# Patient Record
Sex: Male | Born: 2000 | Race: Black or African American | Hispanic: No | Marital: Single | Smoking: Current every day smoker
Health system: Southern US, Community
[De-identification: ages and names within clinical notes are randomized; demographics above are authoritative.]

## PROBLEM LIST (undated history)

## (undated) DIAGNOSIS — F909 Attention-deficit hyperactivity disorder, unspecified type: Secondary | ICD-10-CM

---
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
## 2019-11-02 ENCOUNTER — Other Ambulatory Visit: Payer: Self-pay

## 2019-11-02 ENCOUNTER — Encounter (HOSPITAL_COMMUNITY): Payer: Self-pay

## 2019-11-02 ENCOUNTER — Ambulatory Visit (HOSPITAL_COMMUNITY)
Admission: EM | Admit: 2019-11-02 | Discharge: 2019-11-02 | Disposition: A | Discharge status: Home / Self Care | Payer: Medicaid Other | Attending: Family Medicine | Admitting: Family Medicine

## 2019-11-02 DIAGNOSIS — K644 Residual hemorrhoidal skin tags: Secondary | ICD-10-CM

## 2019-11-02 MED ORDER — NITROGLYCERIN 2 % TD OINT: 0.5000 [in_us] | TOPICAL_OINTMENT | Freq: Two times a day (BID) | TRANSDERMAL | 3 refills | Status: DC

## 2019-11-02 MED ORDER — HYDROCORTISONE (PERIANAL) 2.5 % EX CREA: 1.0000 "application " | TOPICAL_CREAM | Freq: Two times a day (BID) | CUTANEOUS | 0 refills | Status: DC

## 2019-11-02 NOTE — ED Provider Notes (Signed)
  Michael Leach    CSN: [id number] Arrival date & time: 11/02/19  1025   Chief Complaint  Patient presents with  . Hemorrhoids    Subjective: Patient is a 18 y.o. male here for possible hemorrhoids.  Pt was defecating this AM and his bottom started to hurt worse than usual. He has a personal and famhx of hemorrhoids. He tried OTC cream w/o relief. No bleeding. BM's are on harder side. He does not get much fiber or water in his diet.   ROS: GI: As noted in HPI  Past Medical History:  Diagnosis Date  . ADHD (attention deficit hyperactivity disorder)     Objective: BP 137/90 (BP Location: Left Arm)   Pulse 67   Temp 98 F (36.7 C) (Oral)   Resp 16   SpO2 99%  General: Awake, appears stated age Abd: S, NT, ND, BS+ Rectal: external hemorrhoid L lateral, no fissures or evidence of thrombosis Heart: RRR Lungs: CTAB, no rales, wheezes or rhonchi. No accessory muscle use Psych: Age appropriate judgment and insight, normal affect and mood   Final Clinical Impressions(s) / UC Diagnoses   Final diagnoses:  External hemorrhoid   Soaks. Normalized BM's. Creams as below.   Discharge Instructions   None    ED Prescriptions    Medication Sig Dispense Auth. Provider   hydrocortisone (ANUSOL-HC) 2.5 % rectal cream Place 1 application rectally 2 (two) times daily. 30 g Shelda Pal, DO   nitroGLYCERIN (NITROGLYN) 2 % ointment Apply 0.5 inches topically 2 (two) times daily for 28 days. 30 g Shelda Pal, DO        Riki Sheer [place], [place] 11/02/19 1135

## 2019-11-02 NOTE — ED Triage Notes (Signed)
Pt C/o hemorrhoids states after he used the bathroom this am he butt started hurting very bad.

## 2019-12-13 ENCOUNTER — Ambulatory Visit (HOSPITAL_COMMUNITY)
Admission: EM | Admit: 2019-12-13 | Discharge: 2019-12-13 | Disposition: A | Discharge status: Home / Self Care | Payer: Medicaid Other | Attending: Family Medicine | Admitting: Family Medicine

## 2019-12-13 ENCOUNTER — Encounter (HOSPITAL_COMMUNITY): Payer: Self-pay

## 2019-12-13 DIAGNOSIS — H66004 Acute suppurative otitis media without spontaneous rupture of ear drum, recurrent, right ear: Secondary | ICD-10-CM

## 2019-12-13 MED ORDER — AMOXICILLIN 875 MG PO TABS: 875.0000 mg | ORAL_TABLET | Freq: Two times a day (BID) | ORAL | 0 refills | Status: AC

## 2019-12-13 MED ORDER — NEOMYCIN-POLYMYXIN-HC 3.5-10000-1 OT SUSP: 4.0000 [drp] | OTIC | 0 refills | Status: AC

## 2019-12-13 MED ORDER — IBUPROFEN 800 MG PO TABS: 800.0000 mg | ORAL_TABLET | Freq: Three times a day (TID) | ORAL | 0 refills | Status: AC

## 2019-12-13 NOTE — Discharge Instructions (Signed)
Take the antibiotic 2 x a day Take two doses today Take the ibuprofen as needed pain Use the eardrops until the pain improves Return as needed

## 2019-12-13 NOTE — ED Provider Notes (Signed)
[place]    CSN: [id number] Arrival date & time: 12/13/19  5462      History   Chief Complaint Chief Complaint  Patient presents with  . Otalgia    HPI Michael Leach is a 19 y.o. male.   HPI Patient states that last night he started developing pain in his right ear.  He states his ear is very painful.  He is requesting eardrops.  He has had no cough cold runny nose or sore throat.  No fever.  No history of ear problems in the past. He has not taken any medication for the pain Healthy 19 year old on no medications. Past Medical History:  Diagnosis Date  . ADHD (attention deficit hyperactivity disorder)     There are no problems to display for this patient.   History reviewed. No pertinent surgical history.     Home Medications    Prior to Admission medications   Medication Sig Start Date End Date Taking? Authorizing Provider  amoxicillin (AMOXIL) 875 MG tablet Take 1 tablet (875 mg total) by mouth 2 (two) times daily for 10 days. 12/13/19 12/23/19  Raylene Everts, MD  ibuprofen (ADVIL) 800 MG tablet Take 1 tablet (800 mg total) by mouth 3 (three) times daily. 12/13/19   Raylene Everts, MD  neomycin-polymyxin-hydrocortisone (CORTISPORIN) 3.5-10000-1 OTIC suspension Place 4 drops into the right ear every 4 (four) hours. 12/13/19   Raylene Everts, MD    Family History History reviewed. No pertinent family history.  Social History Social History   Tobacco Use  . Smoking status: Light Tobacco Smoker  . Smokeless tobacco: Never Used  Substance Use Topics  . Alcohol use: Never  . Drug use: Not on file     Allergies   Patient has no known allergies.   Review of Systems Review of Systems  Constitutional: Negative for chills and fever.  HENT: Positive for ear pain. Negative for congestion and hearing loss.   Eyes: Negative for visual disturbance.  Respiratory: Negative for cough and shortness of breath.   Cardiovascular: Negative for chest  pain.  Gastrointestinal: Negative.   Genitourinary: Negative.   Musculoskeletal: Negative for myalgias.  Neurological: Negative.  Negative for headaches.  Psychiatric/Behavioral: Positive for sleep disturbance.     Physical Exam Triage Vital Signs ED Triage Vitals  Enc Vitals Group     BP 12/13/19 0840 121/73     Pulse Rate 12/13/19 0840 77     Resp 12/13/19 0840 16     Temp 12/13/19 0840 98.1 F (36.7 C)     Temp Source 12/13/19 0840 Oral     SpO2 12/13/19 0840 97 %     Weight --      Height --      Head Circumference --      Peak Flow --      Pain Score 12/13/19 0839 8     Pain Loc --      Pain Edu? --      Excl. in [place]? --    No data found.  Updated Vital Signs BP 121/73 (BP Location: Left Arm)   Pulse 77   Temp 98.1 F (36.7 C) (Oral)   Resp 16   SpO2 97%     Physical Exam Constitutional:      General: He is not in acute distress.    Appearance: He is well-developed and normal weight.     Comments: Appears uncomfortable.  Pleasant cooperative  HENT:     Head:  Normocephalic and atraumatic.     Right Ear: There is no impacted cerumen.     Left Ear: Tympanic membrane, ear canal and external ear normal.     Ears:     Comments: Right TM is red and bulging.  External auditory canal is erythematous.  No eardrum perforation    Mouth/Throat:     Mouth: Mucous membranes are moist.     Pharynx: No posterior oropharyngeal erythema.  Eyes:     Conjunctiva/sclera: Conjunctivae normal.     Pupils: Pupils are equal, round, and reactive to light.  Cardiovascular:     Rate and Rhythm: Regular rhythm.  Pulmonary:     Effort: Pulmonary effort is normal. No respiratory distress.  Abdominal:     General: There is no distension.  Musculoskeletal:        General: Normal range of motion.     Cervical back: Normal range of motion.  Skin:    General: Skin is warm and dry.  Neurological:     Mental Status: He is alert. Mental status is at baseline.     Gait: Gait normal.   Psychiatric:        Mood and Affect: Mood normal.        Behavior: Behavior normal.     Comments: Polite and cooperative      UC Treatments / Results  Labs (all labs ordered are listed, but only abnormal results are displayed) Labs Reviewed - No data to display  EKG   Radiology No results found.  Procedures Procedures (including critical care time)  Medications Ordered in UC Medications - No data to display  Initial Impression / Assessment and Plan / UC Course  I have reviewed the triage vital signs and the nursing notes.  Pertinent labs & imaging results that were available during my care of the patient were reviewed by me and considered in my medical decision making (see chart for details).     Final Clinical Impressions(s) / UC Diagnoses   Final diagnoses:  Non-recurrent acute suppurative otitis media of right ear without spontaneous rupture of tympanic membrane     Discharge Instructions     Take the antibiotic 2 x a day Take two doses today Take the ibuprofen as needed pain Use the eardrops until the pain improves Return as needed    ED Prescriptions    Medication Sig Dispense Auth. Provider   amoxicillin (AMOXIL) 875 MG tablet Take 1 tablet (875 mg total) by mouth 2 (two) times daily for 10 days. 20 tablet Eustace Moore, MD   ibuprofen (ADVIL) 800 MG tablet Take 1 tablet (800 mg total) by mouth 3 (three) times daily. 21 tablet Eustace Moore, MD   neomycin-polymyxin-hydrocortisone (CORTISPORIN) 3.5-10000-1 OTIC suspension Place 4 drops into the right ear every 4 (four) hours. 10 mL Eustace Moore, MD     PDMP not reviewed this encounter.   Eustace Moore, MD 12/13/19 1037

## 2019-12-13 NOTE — ED Triage Notes (Signed)
Pt presents to UC with right ear pain since last night. Pt states he feels a lot of pressure in his right ear when he hiccups.

## 2020-03-06 ENCOUNTER — Other Ambulatory Visit (HOSPITAL_COMMUNITY): Payer: Self-pay | Admitting: Radiology

## 2020-03-06 ENCOUNTER — Emergency Department (HOSPITAL_COMMUNITY): Payer: No Typology Code available for payment source

## 2020-03-06 ENCOUNTER — Emergency Department (HOSPITAL_COMMUNITY)
Admission: EM | Admit: 2020-03-06 | Discharge: 2020-03-06 | Disposition: A | Discharge status: Home / Self Care | Payer: No Typology Code available for payment source | Attending: Emergency Medicine | Admitting: Emergency Medicine

## 2020-03-06 ENCOUNTER — Emergency Department: Payer: Medicaid Other

## 2020-03-06 ENCOUNTER — Encounter (HOSPITAL_COMMUNITY): Payer: Self-pay

## 2020-03-06 ENCOUNTER — Other Ambulatory Visit: Payer: Self-pay

## 2020-03-06 DIAGNOSIS — Z720 Tobacco use: Secondary | ICD-10-CM | POA: Insufficient documentation

## 2020-03-06 DIAGNOSIS — M25532 Pain in left wrist: Secondary | ICD-10-CM | POA: Insufficient documentation

## 2020-03-06 DIAGNOSIS — Z79899 Other long term (current) drug therapy: Secondary | ICD-10-CM | POA: Diagnosis not present

## 2020-03-06 DIAGNOSIS — R519 Headache, unspecified: Secondary | ICD-10-CM | POA: Diagnosis present
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
## 2021-04-16 ENCOUNTER — Ambulatory Visit (HOSPITAL_COMMUNITY): Admission: EM | Admit: 2021-04-16 | Payer: Medicaid Other | Source: Home / Self Care

## 2021-04-16 ENCOUNTER — Other Ambulatory Visit: Payer: Self-pay

## 2023-02-19 ENCOUNTER — Encounter (HOSPITAL_COMMUNITY): Payer: Self-pay

## 2023-02-19 ENCOUNTER — Ambulatory Visit (HOSPITAL_COMMUNITY)
Admission: EM | Admit: 2023-02-19 | Discharge: 2023-02-19 | Disposition: A | Discharge status: Home / Self Care | Payer: Medicaid Other | Attending: Internal Medicine | Admitting: Internal Medicine

## 2023-02-19 DIAGNOSIS — R109 Unspecified abdominal pain: Secondary | ICD-10-CM

## 2023-02-19 DIAGNOSIS — R310 Gross hematuria: Secondary | ICD-10-CM | POA: Diagnosis not present

## 2023-02-19 LAB — POCT URINALYSIS DIPSTICK, ED / UC
Bilirubin Urine: NEGATIVE
Glucose, UA: NEGATIVE mg/dL
Ketones, ur: NEGATIVE mg/dL
Leukocytes,Ua: NEGATIVE
Nitrite: NEGATIVE
Protein, ur: NEGATIVE mg/dL
Specific Gravity, Urine: 1.02 (ref 1.005–1.030)
Urobilinogen, UA: 4 mg/dL — ABNORMAL HIGH (ref 0.0–1.0)
pH: 7.5 (ref 5.0–8.0)

## 2023-02-19 MED ORDER — TAMSULOSIN HCL 0.4 MG PO CAPS: 0.4000 mg | ORAL_CAPSULE | Freq: Every day | ORAL | 0 refills | Status: AC

## 2023-02-19 MED ORDER — KETOROLAC TROMETHAMINE 30 MG/ML IJ SOLN
30.0000 mg | Freq: Once | INTRAMUSCULAR | Status: AC
  Administered 2023-02-19: 30 mg via INTRAMUSCULAR

## 2023-02-19 MED ORDER — KETOROLAC TROMETHAMINE 30 MG/ML IJ SOLN
INTRAMUSCULAR | Status: AC
  Filled 2023-02-19: qty 1

## 2023-02-19 NOTE — ED Triage Notes (Signed)
Patient c/o intermittent left flank pain and hematuria only when having flank pain x 3-4 days.  Patient states he had Tylenol last night.

## 2023-02-19 NOTE — ED Provider Notes (Incomplete)
[facility]    CSN: [id number] Arrival date & time: 02/19/23  1143      History   Chief Complaint Chief Complaint  Patient presents with   Flank Pain   Hematuria         HPI Michael Leach is a 22 y.o. male.   Patient presents to urgent care for evaluation of left sided flank pain for the last 5 days with associated gross hematuria.  He states he only experiences gross hematuria when his left flank is hurting and says urine appears dark brown when this happens. Happens intermittently, cannot quantify how many times per day over the last 5 days. States pain to the left flank does not radiate and is worsened with movement. He did start going to the gym 2 weeks ago to lift weights and denies generalized muscle pain, fever/chills, urinary frequency, urinary urgency, and urinary hesitancy. No personal or family history of kidney stones.  Denies abdominal pain, nausea, vomiting, diarrhea, constipation, blood/mucus to the stools, lower back pain, saddle anesthesia symptoms, and recent treatment for STD/urinary tract infection.  Normal appetite, drinking plenty of water.  Denies frequent intake of urinary irritants.  No recent excessive use of NSAID.  Has not attempted use of any over-the-counter medications to help with symptoms before coming to urgent care.  Has never experienced these types of symptoms in the past.   Flank Pain  Hematuria    Past Medical History:  Diagnosis Date   ADHD (attention deficit hyperactivity disorder)     There are no problems to display for this patient.   History reviewed. No pertinent surgical history.     Home Medications    Prior to Admission medications   Medication Sig Start Date End Date Taking? Authorizing Provider  tamsulosin (FLOMAX) 0.4 MG CAPS capsule Take 1 capsule (0.4 mg total) by mouth daily after breakfast for 14 days. 02/19/23 03/05/23 Yes Talbot Grumbling, FNP  ibuprofen (ADVIL) 800 MG tablet Take 1 tablet (800 mg  total) by mouth 3 (three) times daily. 12/13/19   Raylene Everts, MD  neomycin-polymyxin-hydrocortisone (CORTISPORIN) 3.5-10000-1 OTIC suspension Place 4 drops into the right ear every 4 (four) hours. 12/13/19   Raylene Everts, MD  lisdexamfetamine (VYVANSE) 40 MG capsule Take by mouth every morning.  12/13/19  [provider]  nitroGLYCERIN (NITROGLYN) 2 % ointment Apply 0.5 inches topically 2 (two) times daily for 28 days. 11/02/19 12/13/19  Shelda Pal, DO    Family History History reviewed. No pertinent family history.  Social History Social History   Tobacco Use   Smoking status: Every Day    Types: Cigars   Smokeless tobacco: Never  Vaping Use   Vaping Use: Never used  Substance Use Topics   Alcohol use: Yes   Drug use: Never     Allergies   Patient has no known allergies.   Review of Systems Review of Systems  Genitourinary:  Positive for flank pain and hematuria.  Per HPI   Physical Exam Triage Vital Signs ED Triage Vitals  Enc Vitals Group     BP 02/19/23 1221 108/67     Pulse Rate 02/19/23 1221 88     Resp 02/19/23 1221 14     Temp 02/19/23 1221 98.4 F (36.9 C)     Temp Source 02/19/23 1221 Oral     SpO2 02/19/23 1221 98 %     Weight --      Height --  Head Circumference --      Peak Flow --      Pain Score 02/19/23 1223 7     Pain Loc --      Pain Edu? --      Excl. in [place]? --    No data found.  Updated Vital Signs BP 108/67 (BP Location: Left Arm)   Pulse 88   Temp 98.4 F (36.9 C) (Oral)   Resp 14   SpO2 98%   Visual Acuity Right Eye Distance:   Left Eye Distance:   Bilateral Distance:    Right Eye Near:   Left Eye Near:    Bilateral Near:     Physical Exam Vitals and nursing note reviewed.  Constitutional:      Appearance: He is not ill-appearing or toxic-appearing.  HENT:     Head: Normocephalic and atraumatic.     Right Ear: Hearing, tympanic membrane, ear canal and external ear normal.     Left  Ear: Hearing, tympanic membrane, ear canal and external ear normal.     Nose: Nose normal.     Mouth/Throat:     Lips: Pink.     Mouth: Mucous membranes are moist. No injury.     Tongue: No lesions. Tongue does not deviate from midline.     Palate: No mass and lesions.     Pharynx: Oropharynx is clear. Uvula midline. No pharyngeal swelling, oropharyngeal exudate, posterior oropharyngeal erythema or uvula swelling.     Tonsils: No tonsillar exudate or tonsillar abscesses.  Eyes:     General: Lids are normal. Vision grossly intact. Gaze aligned appropriately.     Extraocular Movements: Extraocular movements intact.     Conjunctiva/sclera: Conjunctivae normal.  Cardiovascular:     Rate and Rhythm: Normal rate and regular rhythm.     Heart sounds: Normal heart sounds, S1 normal and S2 normal.  Pulmonary:     Effort: Pulmonary effort is normal. No respiratory distress.     Breath sounds: Normal breath sounds and air entry. No wheezing, rhonchi or rales.  Abdominal:     General: Bowel sounds are normal.     Palpations: Abdomen is soft.     Tenderness: There is no abdominal tenderness. There is left CVA tenderness. There is no right CVA tenderness or guarding.  Musculoskeletal:     Cervical back: Neck supple.  Skin:    General: Skin is warm and dry.     Capillary Refill: Capillary refill takes less than 2 seconds.     Findings: No rash.  Neurological:     General: No focal deficit present.     Mental Status: He is alert and oriented to person, place, and time. Mental status is at baseline.     Cranial Nerves: No dysarthria or facial asymmetry.  Psychiatric:        Mood and Affect: Mood normal.        Speech: Speech normal.        Behavior: Behavior normal.        Thought Content: Thought content normal.        Judgment: Judgment normal.      UC Treatments / Results  Labs (all labs ordered are listed, but only abnormal results are displayed) Labs Reviewed  POCT URINALYSIS  DIPSTICK, ED / UC - Abnormal; Notable for the following components:      Result Value   Hgb urine dipstick LARGE (*)    Urobilinogen, UA 4.0 (*)    All other  components within normal limits  CBC  BASIC METABOLIC PANEL    EKG   Radiology No results found.  Procedures Procedures (including critical care time)  Medications Ordered in UC Medications  ketorolac (TORADOL) 30 MG/ML injection 30 mg (30 mg Intramuscular Given 02/19/23 1340)    Initial Impression / Assessment and Plan / UC Course  I have reviewed the triage vital signs and the nursing notes.  Pertinent labs & imaging results that were available during my care of the patient were reviewed by me and considered in my medical decision making (see chart for details).   1.  Gross hematuria, left flank pain Clinical concern for nephrolithiasis etiology versus muscle strain.  Left CVA tenderness positive on physical exam, otherwise well-appearing with hemodynamically stable vital signs and tolerating food and fluids well without nausea or vomiting.  Does not appear to be dehydrated or significantly uncomfortable.  Patient given 30 mg ketorolac IM for discomfort likely related to renal stone.  Flomax once daily until symptoms improved sent to pharmacy (14 pills).  Low suspicion he will need to use all 14 pills and may stop taking this once he feels better.  Urinary strainer provided, advised to strain urine and bring stone to urologist follow-up appointment should he find 1 in the [place] in the next couple of days.  Walking referral to [facility] provided.  I would like for him to return to urgent care in the next 2 to 3 days to have your urine rechecked to ensure symptoms have improved with use of interventions above.  No ibuprofen until symptoms improve.  Discussed obtaining basic blood work with patient, he is agreeable with plan, however nursing staff unable to get blood in clinic so labs discontinued.  Low suspicion for AKI.   Low suspicion for acute cystitis/infectious etiology as he is afebrile in clinic without any antipyretic. Advised to push fluids including Pedialyte and lots of water to stay well-hydrated.  Strict ER and urgent care return precautions discussed.  Deferred imaging of the abdomen due to low suspicion for obstructing stone based on presentation.    Discussed physical exam and available lab work findings in clinic with patient.  Counseled patient regarding appropriate use of medications and potential side effects for all medications recommended or prescribed today. Discussed red flag signs and symptoms of worsening condition,when to call the PCP office, return to urgent care, and when to seek higher level of care in the emergency department. Patient verbalizes understanding and agreement with plan. All questions answered. Patient discharged in stable condition.    Final Clinical Impressions(s) / UC Diagnoses   Final diagnoses:  Gross hematuria  Left flank pain     Discharge Instructions      I am concerned that you may have a kidney stone.  Please use the urine strainer provided in the clinic to strain your urine to catch the stone. Save the stone for urology follow-up.   I checked basic blood work today to ensure that your electrolytes and kidney function are normal. I will call you if your blood work is abnormal.  I gave you a shot of medicine in the clinic today to help with your pain. Avoid taking ibuprofen for the next few days until your symptoms improve.  You may take Tylenol as needed for pain.  Please call the phone number on your paperwork for the urologist to schedule a follow-up appointment for further evaluation of this.  Please return to urgent care in the next 4  to 5 days to have your urine rechecked to make sure that it looks better.  Return sooner if your symptoms worsen.  If you develop any new or worsening symptoms or do not improve in the next 2 to 3 days, please  return.  If your symptoms are severe, please go to the emergency room.  Follow-up with your primary care provider for further evaluation and management of your symptoms as well as ongoing wellness visits.  I hope you feel better!   ED Prescriptions     Medication Sig Dispense Auth. Provider   tamsulosin (FLOMAX) 0.4 MG CAPS capsule Take 1 capsule (0.4 mg total) by mouth daily after breakfast for 14 days. 14 capsule Talbot Grumbling, FNP      PDMP not reviewed this encounter.   Talbot Grumbling, FNP 02/20/23 1959    Talbot Grumbling, FNP 02/20/23 2000

## 2023-02-19 NOTE — Discharge Instructions (Addendum)
I am concerned that you may have a kidney stone.  Please use the urine strainer provided in the clinic to strain your urine to catch the stone. Save the stone for urology follow-up.   I checked basic blood work today to ensure that your electrolytes and kidney function are normal. I will call you if your blood work is abnormal.  I gave you a shot of medicine in the clinic today to help with your pain. Avoid taking ibuprofen for the next few days until your symptoms improve.  You may take Tylenol as needed for pain.  Please call the phone number on your paperwork for the urologist to schedule a follow-up appointment for further evaluation of this.  Please return to urgent care in the next 4 to 5 days to have your urine rechecked to make sure that it looks better.  Return sooner if your symptoms worsen.  If you develop any new or worsening symptoms or do not improve in the next 2 to 3 days, please return.  If your symptoms are severe, please go to the emergency room.  Follow-up with your primary care provider for further evaluation and management of your symptoms as well as ongoing wellness visits.  I hope you feel better!
